# Patient Record
Sex: Male | Born: 1955 | Race: White | Hispanic: No | Smoking: Current every day smoker
Health system: Southern US, Community
[De-identification: ages and names within clinical notes are randomized; demographics above are authoritative.]

## PROBLEM LIST (undated history)

## (undated) DIAGNOSIS — B029 Zoster without complications: Secondary | ICD-10-CM

## (undated) DIAGNOSIS — J449 Chronic obstructive pulmonary disease, unspecified: Secondary | ICD-10-CM

## (undated) DIAGNOSIS — E119 Type 2 diabetes mellitus without complications: Secondary | ICD-10-CM

## (undated) HISTORY — PX: HAND SURGERY: SHX662

## (undated) HISTORY — PX: ADENOIDECTOMY: SUR15

## (undated) HISTORY — PX: INNER EAR SURGERY: SHX679

---

## 2012-05-30 ENCOUNTER — Encounter (HOSPITAL_BASED_OUTPATIENT_CLINIC_OR_DEPARTMENT_OTHER): Payer: Self-pay | Admitting: Emergency Medicine

## 2012-05-30 ENCOUNTER — Emergency Department (HOSPITAL_BASED_OUTPATIENT_CLINIC_OR_DEPARTMENT_OTHER)
Admission: EM | Admit: 2012-05-30 | Discharge: 2012-05-30 | Disposition: A | Discharge status: Home / Self Care | Payer: Self-pay | Attending: Emergency Medicine | Admitting: Emergency Medicine

## 2012-05-30 DIAGNOSIS — I10 Essential (primary) hypertension: Secondary | ICD-10-CM | POA: Insufficient documentation

## 2012-05-30 DIAGNOSIS — R110 Nausea: Secondary | ICD-10-CM | POA: Insufficient documentation

## 2012-05-30 DIAGNOSIS — J449 Chronic obstructive pulmonary disease, unspecified: Secondary | ICD-10-CM | POA: Insufficient documentation

## 2012-05-30 DIAGNOSIS — F17200 Nicotine dependence, unspecified, uncomplicated: Secondary | ICD-10-CM | POA: Insufficient documentation

## 2012-05-30 DIAGNOSIS — J4489 Other specified chronic obstructive pulmonary disease: Secondary | ICD-10-CM | POA: Insufficient documentation

## 2012-05-30 DIAGNOSIS — K029 Dental caries, unspecified: Secondary | ICD-10-CM

## 2012-05-30 DIAGNOSIS — E119 Type 2 diabetes mellitus without complications: Secondary | ICD-10-CM | POA: Insufficient documentation

## 2012-05-30 DIAGNOSIS — K089 Disorder of teeth and supporting structures, unspecified: Secondary | ICD-10-CM | POA: Insufficient documentation

## 2012-05-30 HISTORY — DX: Chronic obstructive pulmonary disease, unspecified: J44.9

## 2012-05-30 HISTORY — DX: Type 2 diabetes mellitus without complications: E11.9

## 2012-05-30 MED ORDER — OXYCODONE-ACETAMINOPHEN 5-325 MG PO TABS
2.0000 | ORAL_TABLET | Freq: Once | ORAL | Status: AC
  Administered 2012-05-30: 2 via ORAL
  Filled 2012-05-30 (×2): qty 2

## 2012-05-30 MED ORDER — OXYCODONE-ACETAMINOPHEN 5-325 MG PO TABS: ORAL_TABLET | ORAL | Status: DC

## 2012-05-30 MED ORDER — BUPIVACAINE HCL (PF) 0.5 % IJ SOLN
10.0000 mL | Freq: Once | INTRAMUSCULAR | Status: AC
  Administered 2012-05-30: 10 mL
  Filled 2012-05-30: qty 10

## 2012-05-30 MED ORDER — ERYTHROMYCIN BASE 250 MG PO TABS: 250.0000 mg | ORAL_TABLET | Freq: Four times a day (QID) | ORAL | Status: DC

## 2012-05-30 NOTE — ED Provider Notes (Addendum)
History  This chart was scribed for Benjamin Garrison. Oletta Lamas, MD by Thad Ranger, ED Scribe. This patient was seen in room MH02/MH02 and the patient's care was started at 5:35.  CSN: [id number]  Arrival date & time 05/30/12  1648   None     Chief Complaint  Patient presents with  . Jaw Pain   The history is provided by the patient. No language interpreter was used.    Tanuj Mullens is a 56 y.o. male with a history of dental pain and DM, who presents to the Emergency Department complaining of severe, localized, gradually worsening, constant right bottom jaw pain due to dental decay onset yesterday. There is associated nausea, and dizziness. Pain is aggravated by eating and drinking. He says that he took several pills of Advil with no relief. Patient denies fever, facial swelling, vomiting, ear pain, headache, and any other symptoms. He is a current everyday smoker but he denies alcohol use.    Past Medical History  Diagnosis Date  . COPD (chronic obstructive pulmonary disease)   . Hypertension   . Diabetes mellitus without complication     History reviewed. No pertinent past surgical history.  History reviewed. No pertinent family history.  History  Substance Use Topics  . Smoking status: Current Every Day Smoker -- 0.5 packs/day  . Smokeless tobacco: Not on file  . Alcohol Use: No     Review of Systems  Constitutional: Negative for fever and chills.  HENT: Positive for dental problem. Negative for ear pain and facial swelling.   Gastrointestinal: Positive for nausea. Negative for vomiting.  Neurological: Negative for headaches.    Allergies  Hydrocodone and Penicillins  Home Medications   Current Outpatient Rx  Name  Route  Sig  Dispense  Refill  . ERYTHROMYCIN BASE 250 MG PO TABS   Oral   Take 1 tablet (250 mg total) by mouth every 6 (six) hours.   28 tablet   0   . OXYCODONE-ACETAMINOPHEN 5-325 MG PO TABS      1-2 tablets po q 6 hours prn moderate to severe  pain   20 tablet   0     BP 127/81  Pulse 100  Temp 98.3 F (36.8 C) (Oral)  Resp 20  Ht 5\' 9"  (1.753 m)  Wt 240 lb (108.863 kg)  BMI 35.44 kg/m2  SpO2 95%  Physical Exam  Constitutional: He is oriented to person, place, and time. He appears well-developed and well-nourished.  HENT:  Head: Normocephalic and atraumatic.  Right Ear: External ear normal.  Left Ear: External ear normal.  Mouth/Throat: Oropharynx is clear and moist.       Patient is missing first molar.  There are several fillings. There is general tooth decay in the second and third molar. Second molar has severe tenderness, however there is no abscess.   Eyes: Conjunctivae normal and EOM are normal. Pupils are equal, round, and reactive to light. Left eye exhibits no discharge.  Neck: Normal range of motion. Neck supple.  Cardiovascular: Normal rate, regular rhythm and normal heart sounds.   No murmur heard. Pulmonary/Chest: Effort normal and breath sounds normal.  Abdominal: Soft. He exhibits no distension. There is no tenderness. There is no rebound.  Musculoskeletal: Normal range of motion. He exhibits no edema and no tenderness.  Neurological: He is alert and oriented to person, place, and time. He has normal reflexes. No cranial nerve deficit. Coordination normal.  Skin: Skin is warm and dry. No rash noted.  No erythema.    ED Course  NERVE BLOCK Date/Time: 05/30/2012 6:10 PM Performed by: Lear Ng. Authorized by: Lear Ng Consent: Verbal consent obtained. Risks and benefits: risks, benefits and alternatives were discussed Consent given by: patient Patient understanding: patient states understanding of the procedure being performed Patient consent: the patient's understanding of the procedure matches consent given Procedure consent: procedure consent matches procedure scheduled Patient identity confirmed: verbally with patient Time out: Immediately prior to procedure a "time out" was  called to verify the correct patient, procedure, equipment, support staff and site/side marked as required. Indications: pain relief Body area: head Nerve: inferior alveolar Laterality: right Patient sedated: no Preparation: Patient was prepped and draped in the usual sterile fashion. Patient position: sitting Needle gauge: 27 G Location technique: anatomical landmarks Local anesthetic: bupivacaine 0.5% without epinephrine Anesthetic total: 1.5 ml Outcome: pain improved Patient tolerance: Patient tolerated the procedure well with no immediate complications.   (including critical care time)  DIAGNOSTIC STUDIES: Oxygen Saturation is 95% on room air, normal by my interpretation.    COORDINATION OF CARE: 5:40 PM Discussed treatment plan with pt at bedside and pt agreed to plan.   Labs Reviewed - No data to display No results found.   1. Pain due to dental caries    ECG done at triage by protocol.  Time 16:57, sinus at rate 103, incomplete RBBB, right axis, no ST or T wave abn's.  No prior ECG's available.  Borderline ECG is my interpretation.    MDM  I personally performed the services described in this documentation, which was scribed in my presence. The recorded information has been reviewed and is accurate.  Pt with dental caries, dental pain adn likely referred pain to side of face, jaw and neck.  Very tender at 2nd molar on lower right.  Will refer to Dr. Mayford Knife, DDS and pt will call on Monday.     Benjamin Garrison. Oletta Lamas, MD 05/30/12 1812  Benjamin Garrison. Oletta Lamas, MD 05/30/12 1827

## 2012-05-30 NOTE — Discharge Instructions (Signed)
Narcotic and benzodiazepine use may cause drowsiness, slowed breathing or dependence.  Please use with caution and do not drive, operate machinery or watch young children alone while taking them.  Taking combinations of these medications or drinking alcohol will potentiate these effects.    

## 2012-05-30 NOTE — ED Notes (Signed)
Pt with extensive dental decay reports right jaw pain and nausea as well as dizziness.

## 2012-09-29 ENCOUNTER — Emergency Department (HOSPITAL_BASED_OUTPATIENT_CLINIC_OR_DEPARTMENT_OTHER)
Admission: EM | Admit: 2012-09-29 | Discharge: 2012-09-29 | Disposition: A | Discharge status: Home / Self Care | Payer: Self-pay | Attending: Emergency Medicine | Admitting: Emergency Medicine

## 2012-09-29 ENCOUNTER — Encounter (HOSPITAL_BASED_OUTPATIENT_CLINIC_OR_DEPARTMENT_OTHER): Payer: Self-pay | Admitting: *Deleted

## 2012-09-29 ENCOUNTER — Emergency Department (HOSPITAL_BASED_OUTPATIENT_CLINIC_OR_DEPARTMENT_OTHER): Payer: Self-pay

## 2012-09-29 DIAGNOSIS — R0602 Shortness of breath: Secondary | ICD-10-CM | POA: Insufficient documentation

## 2012-09-29 DIAGNOSIS — R209 Unspecified disturbances of skin sensation: Secondary | ICD-10-CM | POA: Insufficient documentation

## 2012-09-29 DIAGNOSIS — R5381 Other malaise: Secondary | ICD-10-CM | POA: Insufficient documentation

## 2012-09-29 DIAGNOSIS — R112 Nausea with vomiting, unspecified: Secondary | ICD-10-CM | POA: Insufficient documentation

## 2012-09-29 DIAGNOSIS — IMO0001 Reserved for inherently not codable concepts without codable children: Secondary | ICD-10-CM | POA: Insufficient documentation

## 2012-09-29 DIAGNOSIS — J4489 Other specified chronic obstructive pulmonary disease: Secondary | ICD-10-CM | POA: Insufficient documentation

## 2012-09-29 DIAGNOSIS — R109 Unspecified abdominal pain: Secondary | ICD-10-CM | POA: Insufficient documentation

## 2012-09-29 DIAGNOSIS — E119 Type 2 diabetes mellitus without complications: Secondary | ICD-10-CM | POA: Insufficient documentation

## 2012-09-29 DIAGNOSIS — I10 Essential (primary) hypertension: Secondary | ICD-10-CM | POA: Insufficient documentation

## 2012-09-29 DIAGNOSIS — G629 Polyneuropathy, unspecified: Secondary | ICD-10-CM

## 2012-09-29 DIAGNOSIS — R531 Weakness: Secondary | ICD-10-CM

## 2012-09-29 DIAGNOSIS — G589 Mononeuropathy, unspecified: Secondary | ICD-10-CM | POA: Insufficient documentation

## 2012-09-29 DIAGNOSIS — F17200 Nicotine dependence, unspecified, uncomplicated: Secondary | ICD-10-CM | POA: Insufficient documentation

## 2012-09-29 LAB — URINALYSIS, ROUTINE W REFLEX MICROSCOPIC
Bilirubin Urine: NEGATIVE
Glucose, UA: NEGATIVE mg/dL
Hgb urine dipstick: NEGATIVE
Ketones, ur: NEGATIVE mg/dL
Leukocytes, UA: NEGATIVE
Nitrite: NEGATIVE
Protein, ur: NEGATIVE mg/dL
Specific Gravity, Urine: 1.011 (ref 1.005–1.030)
Urobilinogen, UA: 0.2 mg/dL (ref 0.0–1.0)
pH: 6 (ref 5.0–8.0)

## 2012-09-29 LAB — COMPREHENSIVE METABOLIC PANEL
ALT: 24 U/L (ref 0–53)
AST: 17 U/L (ref 0–37)
Albumin: 3.9 g/dL (ref 3.5–5.2)
Alkaline Phosphatase: 77 U/L (ref 39–117)
BUN: 13 mg/dL (ref 6–23)
CO2: 28 mEq/L (ref 19–32)
Calcium: 9.8 mg/dL (ref 8.4–10.5)
Chloride: 100 mEq/L (ref 96–112)
Creatinine, Ser: 0.7 mg/dL (ref 0.50–1.35)
GFR calc Af Amer: >90 mL/min (ref >90)
GFR calc non Af Amer: >90 mL/min (ref >90)
Glucose, Bld: 154 mg/dL — ABNORMAL HIGH (ref 70–99)
Potassium: 4.6 mEq/L (ref 3.5–5.1)
Sodium: 137 mEq/L (ref 135–145)
Total Bilirubin: 0.5 mg/dL (ref 0.3–1.2)
Total Protein: 7.6 g/dL (ref 6.0–8.3)

## 2012-09-29 LAB — CBC WITH DIFFERENTIAL/PLATELET
Basophils Absolute: 0 10*3/uL (ref 0.0–0.1)
Basophils Relative: 0 % (ref 0–1)
Eosinophils Absolute: 0.2 10*3/uL (ref 0.0–0.7)
Eosinophils Relative: 3 % (ref 0–5)
HCT: 46 % (ref 39.0–52.0)
Hemoglobin: 15.8 g/dL (ref 13.0–17.0)
Lymphocytes Relative: 26 % (ref 12–46)
Lymphs Abs: 2 10*3/uL (ref 0.7–4.0)
MCH: 31.1 pg (ref 26.0–34.0)
MCHC: 34.3 g/dL (ref 30.0–36.0)
MCV: 90.6 fL (ref 78.0–100.0)
Monocytes Absolute: 0.7 10*3/uL (ref 0.1–1.0)
Monocytes Relative: 9 % (ref 3–12)
Neutro Abs: 4.8 10*3/uL (ref 1.7–7.7)
Neutrophils Relative %: 62 % (ref 43–77)
Platelets: 168 10*3/uL (ref 150–400)
RBC: 5.08 MIL/uL (ref 4.22–5.81)
RDW: 14.3 % (ref 11.5–15.5)
WBC: 7.8 10*3/uL (ref 4.0–10.5)

## 2012-09-29 LAB — LIPASE, BLOOD: Lipase: 30 U/L (ref 11–59)

## 2012-09-29 MED ORDER — ONDANSETRON 4 MG PO TBDP: 4.0000 mg | ORAL_TABLET | Freq: Three times a day (TID) | ORAL | Status: DC | PRN

## 2012-09-29 MED ORDER — ALBUTEROL SULFATE (5 MG/ML) 0.5% IN NEBU
5.0000 mg | INHALATION_SOLUTION | Freq: Once | RESPIRATORY_TRACT | Status: AC
  Administered 2012-09-29: 5 mg via RESPIRATORY_TRACT
  Filled 2012-09-29: qty 1

## 2012-09-29 MED ORDER — IOHEXOL 300 MG/ML  SOLN
50.0000 mL | Freq: Once | INTRAMUSCULAR | Status: AC | PRN
  Administered 2012-09-29: 50 mL via ORAL

## 2012-09-29 MED ORDER — IPRATROPIUM BROMIDE 0.02 % IN SOLN
0.5000 mg | Freq: Once | RESPIRATORY_TRACT | Status: AC
  Administered 2012-09-29: 0.5 mg via RESPIRATORY_TRACT
  Filled 2012-09-29: qty 2.5

## 2012-09-29 MED ORDER — IOHEXOL 300 MG/ML  SOLN
100.0000 mL | Freq: Once | INTRAMUSCULAR | Status: AC | PRN
  Administered 2012-09-29: 100 mL via INTRAVENOUS

## 2012-09-29 MED ORDER — SODIUM CHLORIDE 0.9 % IV BOLUS (SEPSIS)
1000.0000 mL | Freq: Once | INTRAVENOUS | Status: AC
  Administered 2012-09-29: 1000 mL via INTRAVENOUS

## 2012-09-29 MED ORDER — ONDANSETRON 8 MG PO TBDP
8.0000 mg | ORAL_TABLET | Freq: Once | ORAL | Status: AC
  Administered 2012-09-29: 8 mg via ORAL
  Filled 2012-09-29: qty 1

## 2012-09-29 MED ORDER — MORPHINE SULFATE 4 MG/ML IJ SOLN
4.0000 mg | Freq: Once | INTRAMUSCULAR | Status: AC
  Administered 2012-09-29: 4 mg via INTRAVENOUS
  Filled 2012-09-29: qty 1

## 2012-09-29 MED ORDER — TRAMADOL HCL 50 MG PO TABS: 50.0000 mg | ORAL_TABLET | Freq: Four times a day (QID) | ORAL | Status: DC | PRN

## 2012-09-29 NOTE — ED Provider Notes (Signed)
Medical screening examination/treatment/procedure(s) were performed by non-physician practitioner and as supervising physician I was immediately available for consultation/collaboration.   Aliya Sol B. Travonta Gill, MD 09/29/12 1959 

## 2012-09-29 NOTE — ED Provider Notes (Signed)
History     CSN: [id number]  Arrival date & time 09/29/12  1207   First MD Initiated Contact with Patient 09/29/12 1233      Chief Complaint  Patient presents with  . right shoulder pain   . Nausea    (Consider location/radiation/quality/duration/timing/severity/associated sxs/prior treatment) The history is provided by the patient. No language interpreter was used.  Pt is 57yo male with uncontrolled diabetes and known COPD.  States he has felt generally bad over the past 3-4 days. Has had increased stomach pain and nausea and a "few" episodes of vomiting.   Has severe burning and tingling in his feet which radiates to mid-calf, bilaterally. Also c/o moderate right shoulder pain. Does not recall injuring shoulder. Has not taken anything for his leg or shoulder pain. Pain has not improved since onset. Does not have a PCP to f/u with.  Past Medical History  Diagnosis Date  . COPD (chronic obstructive pulmonary disease)   . Hypertension   . Diabetes mellitus without complication     History reviewed. No pertinent past surgical history.  History reviewed. No pertinent family history.  History  Substance Use Topics  . Smoking status: Current Every Day Smoker -- 0.50 packs/day  . Smokeless tobacco: Not on file  . Alcohol Use: No      Review of Systems  Constitutional: Positive for fatigue. Negative for fever, chills and diaphoresis.  Respiratory: Positive for shortness of breath.   Cardiovascular: Negative for chest pain and leg swelling.  Gastrointestinal: Positive for nausea and vomiting. Negative for diarrhea.  Musculoskeletal: Positive for myalgias.  Skin: Negative for color change.    Allergies  Hydrocodone and Penicillins  Home Medications   Current Outpatient Rx  Name  Route  Sig  Dispense  Refill  . erythromycin (E-MYCIN) 250 MG tablet   Oral   Take 1 tablet (250 mg total) by mouth every 6 (six) hours.   28 tablet   0   . oxyCODONE-acetaminophen  (PERCOCET/ROXICET) 5-325 MG per tablet      1-2 tablets po q 6 hours prn moderate to severe pain   20 tablet   0     BP 122/77  Pulse 109  Temp(Src) 98.2 F (36.8 C) (Oral)  Resp 18  Ht 5\' 9"  (1.753 m)  Wt 230 lb (104.327 kg)  BMI 33.95 kg/m2  SpO2 96%  Physical Exam  Constitutional: He appears well-developed and well-nourished. No distress.  HENT:  Head: Normocephalic and atraumatic.  Mouth/Throat: Oropharynx is clear and moist. No oropharyngeal exudate.  Eyes: Conjunctivae and EOM are normal. Pupils are equal, round, and reactive to light.  Neck: Normal range of motion. Neck supple.  Cardiovascular: Normal rate, regular rhythm and normal heart sounds.   Pulmonary/Chest: Effort normal. No respiratory distress. He has wheezes ( throughout all lung fields). He has no rales. He exhibits no tenderness.  Abdominal: Soft. Bowel sounds are normal. He exhibits mass ( umbilical hernia). He exhibits no distension. There is tenderness ( moderate TTP LUQ and LLQ. ). There is no rebound and no guarding.  Musculoskeletal: Normal range of motion.  Lymphadenopathy:    He has no cervical adenopathy.  Neurological: He is alert.  Skin: Skin is warm and dry. No rash noted. He is not diaphoretic. No erythema.    ED Course  Procedures (including critical care time)  Labs Reviewed - No data to display No results found.   No diagnosis found.    MDM  Pt is a 56yo  male with uncontrolled diabetes and COPD. States he lost his father about 2 weeks ago and has not been taking very good care of himself, including not eating much.  Over past 3-4 days feels generally weak, nauseous, and noticed increased neuropathy pain in both feet up to mid-calf, right shoulder pain, and LLQ pain. Vomited a "few" times.  Checked blood glucose yesterday and today avg 200. His norm: 140-150.  PMH significant for umbilical hernia.    Concern for infectious process, diverticulitis, pancreatitis, possible  pneumonia  12:55 PM Will run basic labs. Gave fluid bolus with morphine and zofran.  Will get CT of abdomen and pelvis.  Without fever, and with hx of known COPD, believe wheezing is chronic. CXR would not be beneficial at this time. Will wait on labs.  Filed Vitals:   09/29/12 1222  BP: 122/77  Pulse: 109  Temp: 98.2 F (36.8 C)  Resp: 18   2:59 PM Labs have come back and are unremarkable.   CT-no acute changes  Pt just finished neb tx and states he is breathing better.  Still c/o stomach pain and leg pain.  Will give another dose of morphine and try PO challenge.  Will likely DC home with tramadol and zofran.   Have pt follow up with urgent care or primary care.  Gave pt Recruitment consultant.  Encouraged pt to start eating more regularly and expressed importance of having continuous care and monitoring for diabetes.  Vitals: unremarkable. Discharged in stable condition.    Discussed pt with attending during ED encounter.       Junius Finner, PA-C 09/29/12 [phone]

## 2012-09-29 NOTE — ED Notes (Signed)
Feeling bad for 3-4 days is having neuropathy in both feet that is bothering him also having abdominal pain with nausea and vomiting onset last night is diabetic last checked his cbg at 1100 today was in 200's. Also has right shoulder pain

## 2013-06-02 ENCOUNTER — Emergency Department (HOSPITAL_BASED_OUTPATIENT_CLINIC_OR_DEPARTMENT_OTHER): Payer: Self-pay

## 2013-06-02 ENCOUNTER — Encounter (HOSPITAL_BASED_OUTPATIENT_CLINIC_OR_DEPARTMENT_OTHER): Payer: Self-pay | Admitting: Emergency Medicine

## 2013-06-02 ENCOUNTER — Emergency Department (HOSPITAL_BASED_OUTPATIENT_CLINIC_OR_DEPARTMENT_OTHER)
Admission: EM | Admit: 2013-06-02 | Discharge: 2013-06-02 | Disposition: A | Discharge status: Home / Self Care | Payer: Self-pay | Attending: Emergency Medicine | Admitting: Emergency Medicine

## 2013-06-02 DIAGNOSIS — R3915 Urgency of urination: Secondary | ICD-10-CM | POA: Insufficient documentation

## 2013-06-02 DIAGNOSIS — Z87448 Personal history of other diseases of urinary system: Secondary | ICD-10-CM | POA: Insufficient documentation

## 2013-06-02 DIAGNOSIS — F17200 Nicotine dependence, unspecified, uncomplicated: Secondary | ICD-10-CM | POA: Insufficient documentation

## 2013-06-02 DIAGNOSIS — R300 Dysuria: Secondary | ICD-10-CM | POA: Insufficient documentation

## 2013-06-02 DIAGNOSIS — J40 Bronchitis, not specified as acute or chronic: Secondary | ICD-10-CM

## 2013-06-02 DIAGNOSIS — IMO0001 Reserved for inherently not codable concepts without codable children: Secondary | ICD-10-CM | POA: Insufficient documentation

## 2013-06-02 DIAGNOSIS — J449 Chronic obstructive pulmonary disease, unspecified: Secondary | ICD-10-CM

## 2013-06-02 DIAGNOSIS — J441 Chronic obstructive pulmonary disease with (acute) exacerbation: Secondary | ICD-10-CM | POA: Insufficient documentation

## 2013-06-02 DIAGNOSIS — E1142 Type 2 diabetes mellitus with diabetic polyneuropathy: Secondary | ICD-10-CM | POA: Insufficient documentation

## 2013-06-02 DIAGNOSIS — R350 Frequency of micturition: Secondary | ICD-10-CM | POA: Insufficient documentation

## 2013-06-02 DIAGNOSIS — R109 Unspecified abdominal pain: Secondary | ICD-10-CM | POA: Insufficient documentation

## 2013-06-02 DIAGNOSIS — R51 Headache: Secondary | ICD-10-CM | POA: Insufficient documentation

## 2013-06-02 DIAGNOSIS — Z880 Allergy status to penicillin: Secondary | ICD-10-CM | POA: Insufficient documentation

## 2013-06-02 DIAGNOSIS — R110 Nausea: Secondary | ICD-10-CM | POA: Insufficient documentation

## 2013-06-02 DIAGNOSIS — R61 Generalized hyperhidrosis: Secondary | ICD-10-CM | POA: Insufficient documentation

## 2013-06-02 DIAGNOSIS — E1149 Type 2 diabetes mellitus with other diabetic neurological complication: Secondary | ICD-10-CM | POA: Insufficient documentation

## 2013-06-02 LAB — URINALYSIS, ROUTINE W REFLEX MICROSCOPIC
Bilirubin Urine: NEGATIVE
Glucose, UA: NEGATIVE mg/dL
Ketones, ur: NEGATIVE mg/dL
Nitrite: NEGATIVE
Protein, ur: NEGATIVE mg/dL
pH: 6 (ref 5.0–8.0)

## 2013-06-02 LAB — GLUCOSE, CAPILLARY

## 2013-06-02 MED ORDER — PREDNISONE 20 MG PO TABS: 60.0000 mg | ORAL_TABLET | Freq: Every day | ORAL | Status: AC

## 2013-06-02 MED ORDER — OXYCODONE-ACETAMINOPHEN 5-325 MG PO TABS: 1.0000 | ORAL_TABLET | ORAL | Status: DC | PRN

## 2013-06-02 MED ORDER — IPRATROPIUM BROMIDE 0.02 % IN SOLN
0.5000 mg | Freq: Once | RESPIRATORY_TRACT | Status: AC
  Administered 2013-06-02: 0.5 mg via RESPIRATORY_TRACT
  Filled 2013-06-02: qty 2.5

## 2013-06-02 MED ORDER — OXYCODONE-ACETAMINOPHEN 5-325 MG PO TABS
1.0000 | ORAL_TABLET | Freq: Once | ORAL | Status: AC
  Administered 2013-06-02: 1 via ORAL
  Filled 2013-06-02: qty 1

## 2013-06-02 MED ORDER — ALBUTEROL SULFATE (5 MG/ML) 0.5% IN NEBU
5.0000 mg | INHALATION_SOLUTION | Freq: Once | RESPIRATORY_TRACT | Status: AC
  Administered 2013-06-02: 5 mg via RESPIRATORY_TRACT
  Filled 2013-06-02: qty 1

## 2013-06-02 MED ORDER — AZITHROMYCIN 250 MG PO TABS: 250.0000 mg | ORAL_TABLET | Freq: Every day | ORAL | Status: DC

## 2013-06-02 MED ORDER — AZITHROMYCIN 250 MG PO TABS
500.0000 mg | ORAL_TABLET | Freq: Once | ORAL | Status: AC
  Administered 2013-06-02: 500 mg via ORAL
  Filled 2013-06-02: qty 2

## 2013-06-02 MED ORDER — PREDNISONE 10 MG PO TABS
60.0000 mg | ORAL_TABLET | Freq: Once | ORAL | Status: AC
  Administered 2013-06-02: 60 mg via ORAL
  Filled 2013-06-02 (×2): qty 1

## 2013-06-02 MED ORDER — ALBUTEROL SULFATE HFA 108 (90 BASE) MCG/ACT IN AERS
2.0000 | INHALATION_SPRAY | RESPIRATORY_TRACT | Status: DC | PRN
  Administered 2013-06-02: 2 via RESPIRATORY_TRACT
  Filled 2013-06-02: qty 6.7

## 2013-06-02 NOTE — ED Notes (Addendum)
Pt c/o cough and body aches  , back pain x 2 weeks

## 2013-06-02 NOTE — ED Provider Notes (Signed)
Medical screening examination/treatment/procedure(s) were performed by non-physician practitioner and as supervising physician I was immediately available for consultation/collaboration.  EKG Interpretation    Date/Time:  Wednesday June 02 2013 15:27:38 EST Ventricular Rate:  84 PR Interval:  146 QRS Duration: 98 QT Interval:  366 QTC Calculation: 432 R Axis:   78 Text Interpretation:  Sinus rhythm with Premature atrial complexes Otherwise normal ECG No significant change since last tracing Confirmed by Anitra Lauth  MD, Sinjin Amero ([id number]) on 06/02/2013 3:40:36 PM              Gwyneth Sprout, MD 06/02/13 2130

## 2013-06-02 NOTE — ED Provider Notes (Signed)
CSN: [id number]     Arrival date & time 06/02/13  1431 History   First MD Initiated Contact with Patient 06/02/13 1437     Chief Complaint  Patient presents with  . Cough   (Consider location/radiation/quality/duration/timing/severity/associated sxs/prior Treatment) HPI Comments: A 57 year old with hx of uncontrolled DM II with neuropathy, CPOD and BPH present to ED for productive cough X 3 weeks. Initially cough was greenish now it becomes greenish brown since last 3 to 4 days and getting worse.   Patient is a 57 y.o. male presenting with cough. The history is provided by the patient. No language interpreter was used.  Cough Smoker: yes ( 5 to 6 cigrattes/day.)   Relieved by:  Nothing Worsened by:  Lying down and deep breathing Ineffective treatments:  Home nebulizer (using his brother's nebulizer ) Associated symptoms: chest pain, diaphoresis, headaches, myalgias, shortness of breath, sinus congestion and wheezing   Associated symptoms: no chills, no ear fullness, no ear pain, no eye discharge, no fever, no rash, no rhinorrhea, no sore throat and no weight loss     Past Medical History  Diagnosis Date  . COPD (chronic obstructive pulmonary disease)   . Diabetes mellitus without complication    History reviewed. No pertinent past surgical history. History reviewed. No pertinent family history. History  Substance Use Topics  . Smoking status: Current Every Day Smoker -- 0.50 packs/day  . Smokeless tobacco: Not on file  . Alcohol Use: No    Review of Systems  Constitutional: Positive for diaphoresis. Negative for fever, chills and weight loss.  HENT: Positive for congestion, postnasal drip and sinus pressure. Negative for ear pain, rhinorrhea, sore throat and trouble swallowing.   Eyes: Negative for pain and discharge.  Respiratory: Positive for cough, chest tightness, shortness of breath and wheezing.   Cardiovascular: Positive for chest pain.  Gastrointestinal: Positive for  nausea. Negative for vomiting and diarrhea.       Suprapubic pain  Endocrine: Positive for polydipsia and polyuria.  Genitourinary: Positive for urgency, frequency and difficulty urinating. Negative for flank pain.  Musculoskeletal: Positive for myalgias.  Skin: Negative for rash.  Neurological: Positive for headaches.    Allergies  Hydrocodone and Penicillins  Home Medications   Current Outpatient Rx  Name  Route  Sig  Dispense  Refill  . azithromycin (ZITHROMAX Z-PAK) 250 MG tablet   Oral   Take 1 tablet (250 mg total) by mouth daily.   4 tablet   0   . predniSONE (DELTASONE) 20 MG tablet   Oral   Take 3 tablets (60 mg total) by mouth daily.   9 tablet   0    BP 115/65  Pulse 89  Temp(Src) 97.7 F (36.5 C) (Oral)  Resp 18  Ht 5\' 8"  (1.727 m)  Wt 210 lb (95.255 kg)  BMI 31.94 kg/m2  SpO2 100% Physical Exam  Constitutional: He is oriented to person, place, and time. He appears well-developed and well-nourished.  HENT:  TTP at frontal and maxillary sinus. Tube placed in R ear. Uvula midline. Tonsils without erythema or edema. Posterior pharynx with erythema and drainage.   Neck:  TTP at anterior neck. No adenopathy.   Cardiovascular: Normal rate, regular rhythm, normal heart sounds and intact distal pulses.   Pulmonary/Chest: Effort normal. He has wheezes. He exhibits tenderness.  Abdominal: Soft. Bowel sounds are normal.  umbilical hernia and TTP at suprapubic area  Neurological: He is alert and oriented to person, place, and time.  Intact sensation bilaterally. No feet ulceration.  Skin: No rash noted.    ED Course  Procedures (including critical care time) Labs Review Labs Reviewed  GLUCOSE, CAPILLARY  URINALYSIS, ROUTINE W REFLEX MICROSCOPIC   Imaging Review Dg Chest 2 View  06/02/2013   CLINICAL DATA:  Cough.  EXAM: CHEST  2 VIEW  COMPARISON:  None.  FINDINGS: The heart size and mediastinal contours are within normal limits. Mild hyperexpansion of  the lungs is noted suggesting chronic obstructive pulmonary disease. Chronic central bronchitic changes are noted. No acute abnormality is noted. The visualized skeletal structures are unremarkable.  IMPRESSION: Findings consistent with chronic obstructive pulmonary disease. No acute cardiopulmonary abnormality seen.   Electronically Signed   By: Roque Lias M.D.   On: 06/02/2013 16:08    EKG Interpretation    Date/Time:  Wednesday June 02 2013 15:27:38 EST Ventricular Rate:  84 PR Interval:  146 QRS Duration: 98 QT Interval:  366 QTC Calculation: 432 R Axis:   78 Text Interpretation:  Sinus rhythm with Premature atrial complexes Otherwise normal ECG No significant change since last tracing Confirmed by Anitra Lauth  MD, WHITNEY ([id number]) on 06/02/2013 3:40:36 PM            MDM   1. Bronchitis   2. COPD (chronic obstructive pulmonary disease)    Patient with COPD, continuous smoker (5-6 cigarettes daily) with cough, night sweats, chest tightness with cough. He reports urinary frequency, polydipsia as an untreated diabetic but with CBG of 97. CXR without PNA, no hypoxia. Will treat with abx as bronchitis, steroids for COPD exacerbation and encourage PCP follow up.    Arnoldo Hooker, PA-C 06/02/13 1620

## 2014-03-03 ENCOUNTER — Encounter (HOSPITAL_BASED_OUTPATIENT_CLINIC_OR_DEPARTMENT_OTHER): Payer: Self-pay | Admitting: Emergency Medicine

## 2014-03-03 ENCOUNTER — Emergency Department (HOSPITAL_BASED_OUTPATIENT_CLINIC_OR_DEPARTMENT_OTHER)
Admission: EM | Admit: 2014-03-03 | Discharge: 2014-03-03 | Disposition: A | Discharge status: Home / Self Care | Payer: Self-pay | Attending: Emergency Medicine | Admitting: Emergency Medicine

## 2014-03-03 ENCOUNTER — Emergency Department (HOSPITAL_BASED_OUTPATIENT_CLINIC_OR_DEPARTMENT_OTHER): Payer: Self-pay

## 2014-03-03 DIAGNOSIS — F17200 Nicotine dependence, unspecified, uncomplicated: Secondary | ICD-10-CM | POA: Insufficient documentation

## 2014-03-03 DIAGNOSIS — E119 Type 2 diabetes mellitus without complications: Secondary | ICD-10-CM | POA: Insufficient documentation

## 2014-03-03 DIAGNOSIS — Z792 Long term (current) use of antibiotics: Secondary | ICD-10-CM | POA: Insufficient documentation

## 2014-03-03 DIAGNOSIS — J441 Chronic obstructive pulmonary disease with (acute) exacerbation: Secondary | ICD-10-CM | POA: Insufficient documentation

## 2014-03-03 DIAGNOSIS — Z880 Allergy status to penicillin: Secondary | ICD-10-CM | POA: Insufficient documentation

## 2014-03-03 DIAGNOSIS — R0602 Shortness of breath: Secondary | ICD-10-CM | POA: Insufficient documentation

## 2014-03-03 DIAGNOSIS — G629 Polyneuropathy, unspecified: Secondary | ICD-10-CM

## 2014-03-03 DIAGNOSIS — G589 Mononeuropathy, unspecified: Secondary | ICD-10-CM | POA: Insufficient documentation

## 2014-03-03 LAB — BASIC METABOLIC PANEL
Anion gap: 11 (ref 5–15)
BUN: 17 mg/dL (ref 6–23)
CHLORIDE: 101 meq/L (ref 96–112)
CO2: 27 meq/L (ref 19–32)
Calcium: 10 mg/dL (ref 8.4–10.5)
Creatinine, Ser: 0.8 mg/dL (ref 0.50–1.35)
GFR calc Af Amer: >90 mL/min (ref >90)
GFR calc non Af Amer: >90 mL/min (ref >90)
GLUCOSE: 111 mg/dL — AB (ref 70–99)
POTASSIUM: 4.3 meq/L (ref 3.7–5.3)
Sodium: 139 mEq/L (ref 137–147)

## 2014-03-03 LAB — CBG MONITORING, ED: Glucose-Capillary: 117 mg/dL — ABNORMAL HIGH (ref 70–99)

## 2014-03-03 LAB — CBC
HEMATOCRIT: 46.8 % (ref 39.0–52.0)
HEMOGLOBIN: 15.9 g/dL (ref 13.0–17.0)
MCH: 31.2 pg (ref 26.0–34.0)
MCHC: 34 g/dL (ref 30.0–36.0)
MCV: 91.9 fL (ref 78.0–100.0)
Platelets: 168 10*3/uL (ref 150–400)
RBC: 5.09 MIL/uL (ref 4.22–5.81)
RDW: 14.8 % (ref 11.5–15.5)
WBC: 6.8 10*3/uL (ref 4.0–10.5)

## 2014-03-03 LAB — TROPONIN I

## 2014-03-03 MED ORDER — TRAMADOL HCL 50 MG PO TABS: 50.0000 mg | ORAL_TABLET | Freq: Four times a day (QID) | ORAL | Status: AC | PRN

## 2014-03-03 MED ORDER — ALBUTEROL SULFATE HFA 108 (90 BASE) MCG/ACT IN AERS
2.0000 | INHALATION_SPRAY | RESPIRATORY_TRACT | Status: DC | PRN
  Administered 2014-03-03: 2 via RESPIRATORY_TRACT
  Filled 2014-03-03: qty 6.7

## 2014-03-03 MED ORDER — TRAMADOL HCL 50 MG PO TABS
50.0000 mg | ORAL_TABLET | Freq: Once | ORAL | Status: AC
  Administered 2014-03-03: 50 mg via ORAL
  Filled 2014-03-03: qty 1

## 2014-03-03 MED ORDER — PREDNISONE 20 MG PO TABS: 40.0000 mg | ORAL_TABLET | Freq: Every day | ORAL | Status: AC

## 2014-03-03 MED ORDER — IPRATROPIUM BROMIDE 0.02 % IN SOLN
0.5000 mg | Freq: Once | RESPIRATORY_TRACT | Status: AC
  Administered 2014-03-03: 0.5 mg via RESPIRATORY_TRACT
  Filled 2014-03-03: qty 2.5

## 2014-03-03 MED ORDER — PREDNISONE 50 MG PO TABS
60.0000 mg | ORAL_TABLET | Freq: Once | ORAL | Status: AC
  Administered 2014-03-03: 60 mg via ORAL
  Filled 2014-03-03 (×2): qty 1

## 2014-03-03 MED ORDER — ALBUTEROL SULFATE (2.5 MG/3ML) 0.083% IN NEBU
5.0000 mg | INHALATION_SOLUTION | Freq: Once | RESPIRATORY_TRACT | Status: AC
  Administered 2014-03-03: 5 mg via RESPIRATORY_TRACT
  Filled 2014-03-03: qty 6

## 2014-03-03 NOTE — ED Notes (Signed)
NP at bedside.

## 2014-03-03 NOTE — ED Notes (Signed)
Pt c/o increased SOB x 4 days with lower back pain also c/o " feet are on fire"

## 2014-03-03 NOTE — Discharge Instructions (Signed)
Chronic Obstructive Pulmonary Disease Exacerbation ° Chronic obstructive pulmonary disease (COPD) is a common lung problem. In COPD, the flow of air from the lungs is limited. COPD exacerbations are times that breathing gets worse and you need extra treatment. Without treatment they can be life threatening. If they happen often, your lungs can become more damaged. °HOME CARE °· Do not smoke. °· Avoid tobacco smoke and other things that bother your lungs. °· If given, take your antibiotic medicine as told. Finish the medicine even if you start to feel better. °· Only take medicines as told by your doctor. °· Drink enough fluids to keep your pee (urine) clear or pale yellow (unless your doctor has told you not to). °· Use a cool mist machine (vaporizer). °· If you use oxygen or a machine that turns liquid medicine into a mist (nebulizer), continue to use them as told. °· Keep up with shots (vaccinations) as told by your doctor. °· Exercise regularly. °· Eat healthy foods. °· Keep all doctor visits as told. °GET HELP RIGHT AWAY IF: °· You are very short of breath and it gets worse. °· You have trouble talking. °· You have bad chest pain. °· You have blood in your spit (sputum). °· You have a fever. °· You keep throwing up (vomiting). °· You feel weak, or you pass out (faint). °· You feel confused. °· You keep getting worse. °MAKE SURE YOU:  °· Understand these instructions. °· Will watch your condition. °· Will get help right away if you are not doing well or get worse. °Document Released: 06/20/2011 Document Revised: 04/21/2013 Document Reviewed: 03/05/2013 °[employer]® Patient Information ©2015 [employer]. This information is not intended to replace advice given to you by your health care provider. Make sure you discuss any questions you have with your health care provider. ° °

## 2014-03-03 NOTE — ED Provider Notes (Signed)
Medical screening examination/treatment/procedure(s) were performed by non-physician practitioner and as supervising physician I was immediately available for consultation/collaboration.   EKG Interpretation   Date/Time:  Thursday March 03 2014 17:14:45 EDT Ventricular Rate:  84 PR Interval:  136 QRS Duration: 100 QT Interval:  362 QTC Calculation: 427 R Axis:   101 Text Interpretation:  Normal sinus rhythm Rightward axis Incomplete right  bundle branch block Borderline ECG No significant change since last  tracing Confirmed by YAO  MD, DAVID ([id number]) on 03/03/2014 5:18:57 PM        Richardean Canalavid H Yao, MD 03/03/14 2233

## 2014-03-03 NOTE — ED Provider Notes (Signed)
CSN: [id number]     Arrival date & time 03/03/14  1642 History   First MD Initiated Contact with Patient 03/03/14 1650     Chief Complaint  Patient presents with  . Shortness of Breath     (Consider location/radiation/quality/duration/timing/severity/associated sxs/prior Treatment) HPI Comments: Pt state that he has had increased sob and wheezing for the last 3 days. Pt states that he has neuropathy in his feet and his is having a lot of pain associated with it. Pt denies fever. States that he has had a cough. States that his cbg is good but couldn't report a number. Smokes cigarettes when he can afford it. States that his lower back hurts when he coughs. Pt states that he has intermittent had cp but he thinks it is related to his newly diagnosed pneumonia  The history is provided by the patient. No language interpreter was used.    Past Medical History  Diagnosis Date  . COPD (chronic obstructive pulmonary disease)   . Diabetes mellitus without complication    History reviewed. No pertinent past surgical history. History reviewed. No pertinent family history. History  Substance Use Topics  . Smoking status: Current Every Day Smoker -- 0.50 packs/day  . Smokeless tobacco: Not on file  . Alcohol Use: No    Review of Systems  Constitutional: Negative.   Respiratory: Positive for shortness of breath and wheezing.   Cardiovascular: Positive for chest pain.      Allergies  Hydrocodone and Penicillins  Home Medications   Prior to Admission medications   Medication Sig Start Date End Date Taking? Authorizing Provider  azithromycin (ZITHROMAX Z-PAK) 250 MG tablet Take 1 tablet (250 mg total) by mouth daily. 06/02/13   Shari A Upstill, PA-C  oxyCODONE-acetaminophen (PERCOCET/ROXICET) 5-325 MG per tablet Take 1 tablet by mouth every 4 (four) hours as needed for severe pain. 06/02/13   Shari A Upstill, PA-C  predniSONE (DELTASONE) 20 MG tablet Take 3 tablets (60 mg total) by mouth  daily. 06/03/13   Shari A Upstill, PA-C   BP 105/71  Pulse 92  Temp(Src) 98.1 F (36.7 C) (Oral)  Resp 18  Ht 5\' 8"  (1.727 m)  Wt 215 lb (97.523 kg)  BMI 32.70 kg/m2  SpO2 94% Physical Exam  Nursing note and vitals reviewed. Constitutional: He is oriented to person, place, and time. He appears well-developed and well-nourished.  HENT:  Head: Normocephalic.  Cardiovascular: Normal rate and regular rhythm.   Pulmonary/Chest: He has wheezes.  Abdominal: Soft. There is no tenderness.  Musculoskeletal: Normal range of motion.  Neurological: He is alert and oriented to person, place, and time.  Skin: Skin is warm and dry.    ED Course  Procedures (including critical care time) Labs Review Labs Reviewed  BASIC METABOLIC PANEL - Abnormal; Notable for the following:    Glucose, Bld 111 (*)    All other components within normal limits  CBG MONITORING, ED - Abnormal; Notable for the following:    Glucose-Capillary 117 (*)    All other components within normal limits  CBC  TROPONIN I    Imaging Review Dg Chest 2 View  03/03/2014   CLINICAL DATA:  Increased shortness of breath for 4 days, back pain, history COPD, diabetes, smoker  EXAM: CHEST  2 VIEW  COMPARISON:  06/02/2013  FINDINGS: Normal heart size, mediastinal contours, and pulmonary vascularity.  Emphysematous and bronchitic changes consistent with history of COPD.  No acute infiltrate, pleural effusion or pneumothorax.  Minimal chronic interstitial  prominence stable.  No acute osseous findings.  IMPRESSION: COPD changes.  No acute abnormalities.   Electronically Signed   By: Ulyses SouthwardMark  Boles M.D.   On: 03/03/2014 17:50     EKG Interpretation   Date/Time:  Thursday March 03 2014 17:14:45 EDT Ventricular Rate:  84 PR Interval:  136 QRS Duration: 100 QT Interval:  362 QTC Calculation: 427 R Axis:   101 Text Interpretation:  Normal sinus rhythm Rightward axis Incomplete right  bundle branch block Borderline ECG No significant  change since last  tracing Confirmed by YAO  MD, DAVID ([id number]) on 03/03/2014 5:18:57 PM      MDM   Final diagnoses:  COPD exacerbation  Neuropathy    Pt no longer wheezing after treatment. Will send home on prednisone and albuterol inhaler. Pt given ultram for neuropathic pain.pt is going [facility]. Educated on possibility of blood sugar elevation    Teressa LowerVrinda Bee Hammerschmidt, NP 03/03/14 [phone]

## 2014-10-29 ENCOUNTER — Encounter (HOSPITAL_BASED_OUTPATIENT_CLINIC_OR_DEPARTMENT_OTHER): Payer: Self-pay | Admitting: *Deleted

## 2014-10-29 ENCOUNTER — Emergency Department (HOSPITAL_BASED_OUTPATIENT_CLINIC_OR_DEPARTMENT_OTHER)
Admission: EM | Admit: 2014-10-29 | Discharge: 2014-10-29 | Disposition: A | Discharge status: Home / Self Care | Payer: Self-pay | Attending: Emergency Medicine | Admitting: Emergency Medicine

## 2014-10-29 DIAGNOSIS — Z720 Tobacco use: Secondary | ICD-10-CM | POA: Insufficient documentation

## 2014-10-29 DIAGNOSIS — R21 Rash and other nonspecific skin eruption: Secondary | ICD-10-CM | POA: Insufficient documentation

## 2014-10-29 DIAGNOSIS — J449 Chronic obstructive pulmonary disease, unspecified: Secondary | ICD-10-CM | POA: Insufficient documentation

## 2014-10-29 DIAGNOSIS — H6691 Otitis media, unspecified, right ear: Secondary | ICD-10-CM | POA: Insufficient documentation

## 2014-10-29 DIAGNOSIS — Z7952 Long term (current) use of systemic steroids: Secondary | ICD-10-CM | POA: Insufficient documentation

## 2014-10-29 DIAGNOSIS — Z880 Allergy status to penicillin: Secondary | ICD-10-CM | POA: Insufficient documentation

## 2014-10-29 DIAGNOSIS — Z8619 Personal history of other infectious and parasitic diseases: Secondary | ICD-10-CM | POA: Insufficient documentation

## 2014-10-29 DIAGNOSIS — E119 Type 2 diabetes mellitus without complications: Secondary | ICD-10-CM | POA: Insufficient documentation

## 2014-10-29 DIAGNOSIS — Z79899 Other long term (current) drug therapy: Secondary | ICD-10-CM | POA: Insufficient documentation

## 2014-10-29 HISTORY — DX: Zoster without complications: B02.9

## 2014-10-29 MED ORDER — ALBUTEROL SULFATE HFA 108 (90 BASE) MCG/ACT IN AERS
2.0000 | INHALATION_SPRAY | Freq: Once | RESPIRATORY_TRACT | Status: AC
  Administered 2014-10-29: 2 via RESPIRATORY_TRACT
  Filled 2014-10-29: qty 6.7

## 2014-10-29 MED ORDER — OXYCODONE-ACETAMINOPHEN 5-325 MG PO TABS: 1.0000 | ORAL_TABLET | Freq: Four times a day (QID) | ORAL | Status: AC | PRN

## 2014-10-29 MED ORDER — AZITHROMYCIN 250 MG PO TABS: 250.0000 mg | ORAL_TABLET | Freq: Every day | ORAL | Status: AC

## 2014-10-29 NOTE — ED Notes (Signed)
Dx with shingles on Wed- taking acyclovir and hydrocodone- rash on right side of trunk- now has pain in right ear

## 2014-10-29 NOTE — Discharge Instructions (Signed)
Shingles Shingles (herpes zoster) is an infection that is caused by the same virus that causes chickenpox (varicella). The infection causes a painful skin rash and fluid-filled blisters, which eventually break open, crust over, and heal. It may occur in any area of the body, but it usually affects only one side of the body or face. The pain of shingles usually lasts about 1 month. However, some people with shingles may develop long-term (chronic) pain in the affected area of the body. Shingles often occurs many years after the person had chickenpox. It is more common:  In people older than 50 years.  In people with weakened immune systems, such as those with HIV, AIDS, or cancer.  In people taking medicines that weaken the immune system, such as transplant medicines.  In people under great stress. CAUSES  Shingles is caused by the varicella zoster virus (VZV), which also causes chickenpox. After a person is infected with the virus, it can remain in the person's body for years in an inactive state (dormant). To cause shingles, the virus reactivates and breaks out as an infection in a nerve root. The virus can be spread from person to person (contagious) through contact with open blisters of the shingles rash. It will only spread to people who have not had chickenpox. When these people are exposed to the virus, they may develop chickenpox. They will not develop shingles. Once the blisters scab over, the person is no longer contagious and cannot spread the virus to others. SIGNS AND SYMPTOMS  Shingles shows up in stages. The initial symptoms may be pain, itching, and tingling in an area of the skin. This pain is usually described as burning, stabbing, or throbbing.In a few days or weeks, a painful red rash will appear in the area where the pain, itching, and tingling were felt. The rash is usually on one side of the body in a band or belt-like pattern. Then, the rash usually turns into fluid-filled  blisters. They will scab over and dry up in approximately 2-3 weeks. Flu-like symptoms may also occur with the initial symptoms, the rash, or the blisters. These may include:  Fever.  Chills.  Headache.  Upset stomach. DIAGNOSIS  Your health care provider will perform a skin exam to diagnose shingles. Skin scrapings or fluid samples may also be taken from the blisters. This sample will be examined under a microscope or sent to a lab for further testing. TREATMENT  There is no specific cure for shingles. Your health care provider will likely prescribe medicines to help you manage the pain, recover faster, and avoid long-term problems. This may include antiviral drugs, anti-inflammatory drugs, and pain medicines. HOME CARE INSTRUCTIONS   Take a cool bath or apply cool compresses to the area of the rash or blisters as directed. This may help with the pain and itching.   Take medicines only as directed by your health care provider.   Rest as directed by your health care provider.  Keep your rash and blisters clean with mild soap and cool water or as directed by your health care provider.  Do not pick your blisters or scratch your rash. Apply an anti-itch cream or numbing creams to the affected area as directed by your health care provider.  Keep your shingles rash covered with a loose bandage (dressing).  Avoid skin contact with:  Babies.   Pregnant women.   Children with eczema.   Elderly people with transplants.   People with chronic illnesses, such as leukemia  or AIDS.   Wear loose-fitting clothing to help ease the pain of material rubbing against the rash.  Keep all follow-up visits as directed by your health care provider.If the area involved is on your face, you may receive a referral for a specialist, such as an eye doctor (ophthalmologist) or an ear, nose, and throat (ENT) doctor. Keeping all follow-up visits will help you avoid eye problems, chronic pain, or  disability.  SEEK IMMEDIATE MEDICAL CARE IF:   You have facial pain, pain around the eye area, or loss of feeling on one side of your face.  You have ear pain or ringing in your ear.  You have loss of taste.  Your pain is not relieved with prescribed medicines.   Your redness or swelling spreads.   You have more pain and swelling.  Your condition is worsening or has changed.   You have a fever. MAKE SURE YOU:  Understand these instructions.  Will watch your condition.  Will get help right away if you are not doing well or get worse. Document Released: 07/01/2005 Document Revised: 11/15/2013 Document Reviewed: 02/13/2012 [facility] Patient Information 2015 [employer], [place]. This information is not intended to replace advice given to you by your health care provider. Make sure you discuss any questions you have with your health care provider.   Otitis Media Otitis media is redness, soreness, and inflammation of the middle ear. Otitis media may be caused by allergies or, most commonly, by infection. Often it occurs as a complication of the common cold. SIGNS AND SYMPTOMS Symptoms of otitis media may include:  Earache.  Fever.  Ringing in your ear.  Headache.  Leakage of fluid from the ear. DIAGNOSIS To diagnose otitis media, your health care provider will examine your ear with an otoscope. This is an instrument that allows your health care provider to see into your ear in order to examine your eardrum. Your health care provider also will ask you questions about your symptoms. TREATMENT  Typically, otitis media resolves on its own within 3-5 days. Your health care provider may prescribe medicine to ease your symptoms of pain. If otitis media does not resolve within 5 days or is recurrent, your health care provider may prescribe antibiotic medicines if he or she suspects that a bacterial infection is the cause. HOME CARE INSTRUCTIONS   If you were prescribed an  antibiotic medicine, finish it all even if you start to feel better.  Take medicines only as directed by your health care provider.  Keep all follow-up visits as directed by your health care provider. SEEK MEDICAL CARE IF:  You have otitis media only in one ear, or bleeding from your nose, or both.  You notice a lump on your neck.  You are not getting better in 3-5 days.  You feel worse instead of better. SEEK IMMEDIATE MEDICAL CARE IF:   You have pain that is not controlled with medicine.  You have swelling, redness, or pain around your ear or stiffness in your neck.  You notice that part of your face is paralyzed.  You notice that the bone behind your ear (mastoid) is tender when you touch it. MAKE SURE YOU:   Understand these instructions.  Will watch your condition.  Will get help right away if you are not doing well or get worse. Document Released: 04/05/2004 Document Revised: 11/15/2013 Document Reviewed: 01/26/2013 [address] [facility] Patient Information 2015 [place], [place]. This information is not intended to replace advice given to you by your health  care provider. Make sure you discuss any questions you have with your health care provider.

## 2014-10-29 NOTE — ED Provider Notes (Signed)
CSN: [id number]     Arrival date & time 10/29/14  1116 History   First MD Initiated Contact with Patient 10/29/14 1147     Chief Complaint  Patient presents with  . Otalgia     (Consider location/radiation/quality/duration/timing/severity/associated sxs/prior Treatment) Patient is a 59 y.o. male presenting with ear pain. The history is provided by the patient. No language interpreter was used.  Otalgia Location:  Right Quality:  Aching Severity:  Severe Timing:  Constant Progression:  Unchanged Chronicity:  New Relieved by:  Nothing Worsened by:  Nothing tried Ineffective treatments:  None tried Associated symptoms: rash   Associated symptoms: no abdominal pain, no congestion, no cough, no diarrhea, no fever, no headaches, no rhinorrhea and no vomiting   Associated symptoms comment:  Developed shingles on right flank and abdomen earlier this week, has been on azithromycin Risk factors: prior ear surgery   Risk factors: no recent travel and no chronic ear infection     Past Medical History  Diagnosis Date  . COPD (chronic obstructive pulmonary disease)   . Diabetes mellitus without complication   . Shingles    Past Surgical History  Procedure Laterality Date  . Inner ear surgery    . Hand surgery    . Adenoidectomy     No family history on file. History  Substance Use Topics  . Smoking status: Current Every Day Smoker -- 0.50 packs/day    Types: Cigarettes  . Smokeless tobacco: Never Used  . Alcohol Use: No    Review of Systems  Constitutional: Negative for fever, activity change, appetite change and fatigue.  HENT: Positive for ear pain. Negative for congestion, facial swelling, rhinorrhea and trouble swallowing.   Eyes: Negative for photophobia and pain.  Respiratory: Negative for cough, chest tightness and shortness of breath.   Cardiovascular: Negative for chest pain and leg swelling.  Gastrointestinal: Negative for nausea, vomiting, abdominal pain, diarrhea  and constipation.  Endocrine: Negative for polydipsia and polyuria.  Genitourinary: Negative for dysuria, urgency, decreased urine volume and difficulty urinating.  Musculoskeletal: Negative for back pain and gait problem.  Skin: Positive for rash. Negative for color change and wound.  Allergic/Immunologic: Negative for immunocompromised state.  Neurological: Negative for dizziness, facial asymmetry, speech difficulty, weakness, numbness and headaches.  Psychiatric/Behavioral: Negative for confusion, decreased concentration and agitation.      Allergies  Penicillins and Codeine  Home Medications   Prior to Admission medications   Medication Sig Start Date End Date Taking? Authorizing Provider  acyclovir (ZOVIRAX) 800 MG tablet Take 800 mg by mouth 5 (five) times daily.   Yes Historical Provider, MD  azithromycin (ZITHROMAX) 250 MG tablet Take 1 tablet (250 mg total) by mouth daily. Take first 2 tablets together, then 1 every day until finished. 10/29/14   Toy CookeyMegan Docherty, MD  oxyCODONE-acetaminophen (ROXICET) 5-325 MG per tablet Take 1-2 tablets by mouth every 6 (six) hours as needed for severe pain. 10/29/14   Toy CookeyMegan Docherty, MD  predniSONE (DELTASONE) 20 MG tablet Take 3 tablets (60 mg total) by mouth daily. 06/03/13   Elpidio AnisShari Upstill, PA-C  predniSONE (DELTASONE) 20 MG tablet Take 2 tablets (40 mg total) by mouth daily. 03/03/14   Teressa LowerVrinda Pickering, NP  traMADol (ULTRAM) 50 MG tablet Take 1 tablet (50 mg total) by mouth every 6 (six) hours as needed. 03/03/14   Teressa LowerVrinda Pickering, NP   BP 113/76 mmHg  Pulse 87  Temp(Src) 98.7 F (37.1 C) (Oral)  Resp 18  Ht 5\' 8"  (1.727 m)  Wt 215 lb (97.523 kg)  BMI 32.70 kg/m2  SpO2 99% Physical Exam  HENT:  Head:    Right Ear: Tympanic membrane is injected.  Right TM is dull and erythematous  Musculoskeletal:       Arms:   ED Course  Procedures (including critical care time) Labs Review Labs Reviewed - No data to display  Imaging  Review No results found.   EKG Interpretation None      MDM   Final diagnoses:  Acute right otitis media, recurrence not specified, unspecified otitis media type    Pt is a 59 y.o. male with Pmhx as above who presents with R ear pain since yesterday after being diagxnosed with shingles earlier this week. He has had chills, no fever,  Nausea, vomiting, diarrhea, or facial pain.  He states that the skin around and behind his ear is painful, but it is inside the ear, which hurts the most.  He is also had decreased hearing on that side.  He has not noticed any shingles lesions on his face.  On physical exam, vital signs are stable.  He is in no acute distress.  He does have shingles lesions on right side of trunk in a dermatomal pattern.  He has a mildly red area near his right eye which he states he believes is from the heating pad .  He was using on his face earlier today.  Right TM is dull and erythematous.  I will place him on azithromycin for likely otitis media, though cannot rule out the he may develop, she was on his face as well, though this would be unlikely.  If he does develop shingles lesions on the face, I asked him to follow up with his eye doctor for eye exam.  I've also refill his pain medication, I have given him an albuterol MDI for home use, and I have encouraged [facility], and help refilling his home prescriptions.  I do not feel he is a good candidate for prednisone given his untreated diabetes, and fact that he is almost 1 week after symptom onset.    Elias Else evaluation in the Emergency Department is complete. It has been determined that no acute conditions requiring further emergency intervention are present at this time. The patient/guardian have been advised of the diagnosis and plan. We have discussed signs and symptoms that warrant return to the ED, such as changes or worsening in symptoms, worsening  pain, fever, trouble breathing, inability to tolerate liquids.       Toy Cookey, MD 10/29/14 [phone]

## 2014-12-31 IMAGING — CR DG CHEST 2V
2 series · 2 of 2 positions shown · non-contrast
Comparison: 06/02/2013

CLINICAL DATA: Increased shortness of breath for 4 days, back pain,
history COPD, diabetes, smoker

EXAM:
CHEST  2 VIEW

[w chest pa]
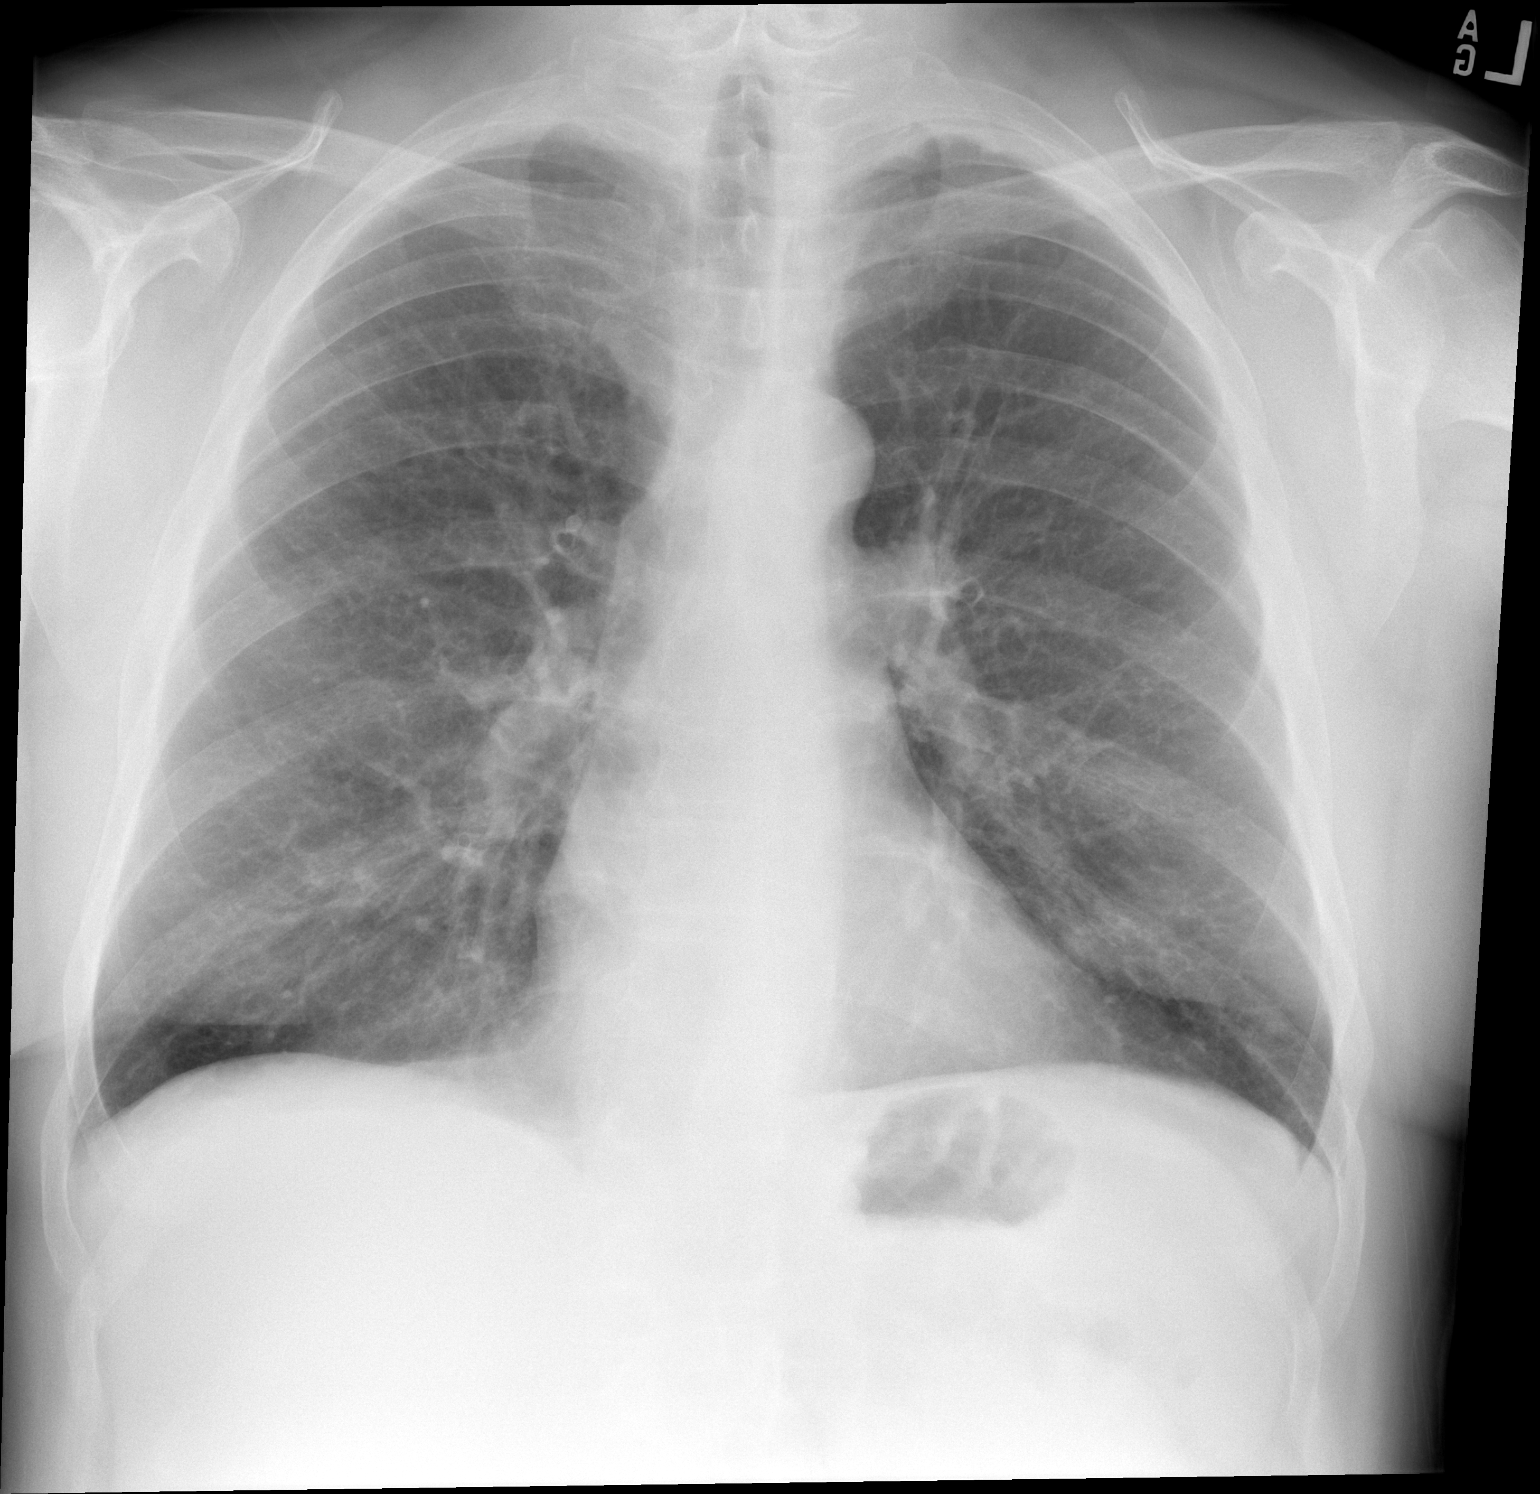

[w chest lat]
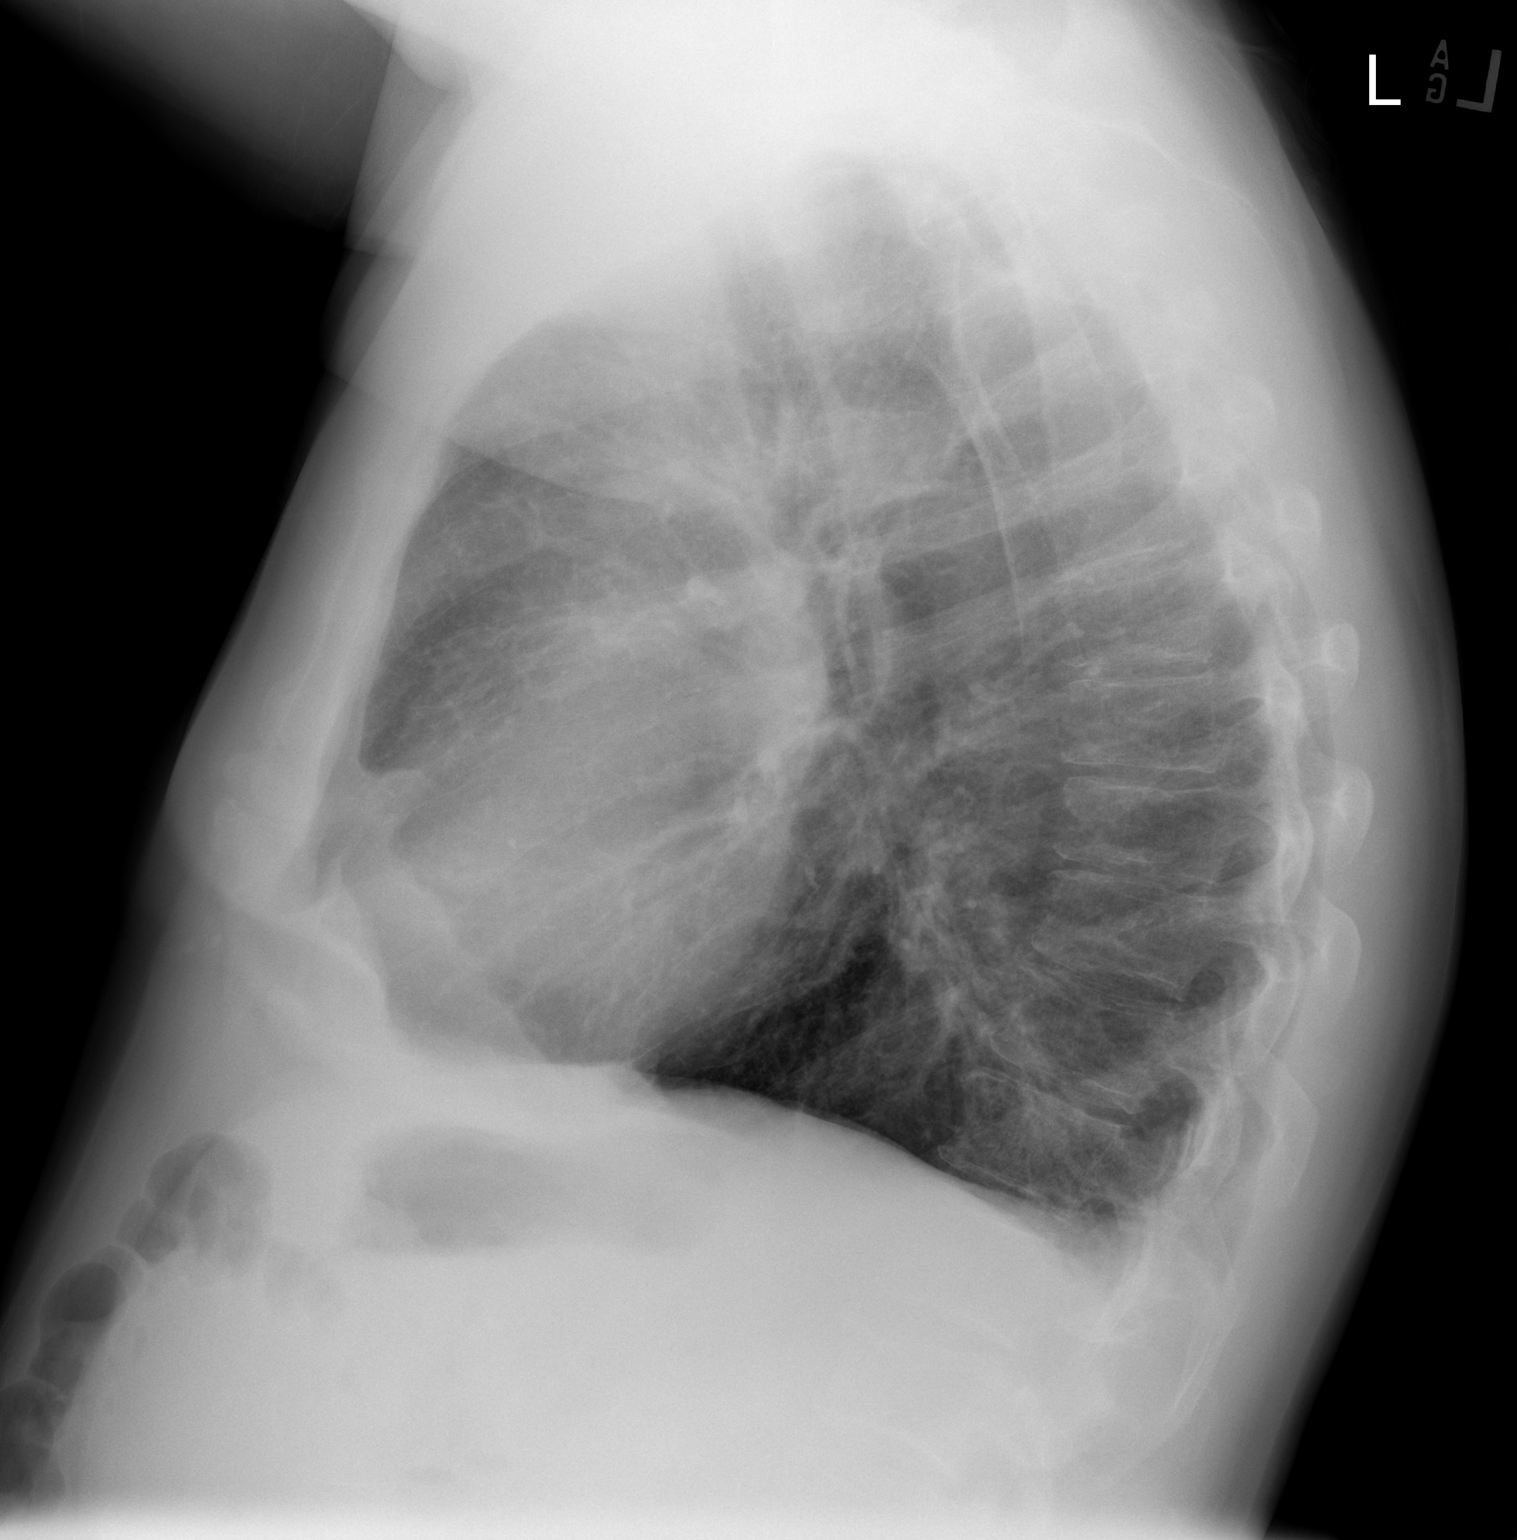

[2 of 2 positions shown; findings below may reference images not displayed]

FINDINGS: Normal heart size, mediastinal contours, and pulmonary vascularity.

Emphysematous and bronchitic changes consistent with history of
COPD.

No acute infiltrate, pleural effusion or pneumothorax.

Minimal chronic interstitial prominence stable.

No acute osseous findings.
IMPRESSION: COPD changes.

No acute abnormalities.

## 2015-06-03 ENCOUNTER — Encounter (HOSPITAL_BASED_OUTPATIENT_CLINIC_OR_DEPARTMENT_OTHER): Payer: Self-pay | Admitting: Emergency Medicine

## 2015-06-03 ENCOUNTER — Emergency Department (HOSPITAL_BASED_OUTPATIENT_CLINIC_OR_DEPARTMENT_OTHER)
Admission: EM | Admit: 2015-06-03 | Discharge: 2015-06-03 | Disposition: A | Discharge status: Home / Self Care | Payer: Self-pay | Attending: Emergency Medicine | Admitting: Emergency Medicine

## 2015-06-03 DIAGNOSIS — Z8619 Personal history of other infectious and parasitic diseases: Secondary | ICD-10-CM | POA: Insufficient documentation

## 2015-06-03 DIAGNOSIS — K0889 Other specified disorders of teeth and supporting structures: Secondary | ICD-10-CM | POA: Insufficient documentation

## 2015-06-03 DIAGNOSIS — F17210 Nicotine dependence, cigarettes, uncomplicated: Secondary | ICD-10-CM | POA: Insufficient documentation

## 2015-06-03 DIAGNOSIS — Z880 Allergy status to penicillin: Secondary | ICD-10-CM | POA: Insufficient documentation

## 2015-06-03 DIAGNOSIS — Z7952 Long term (current) use of systemic steroids: Secondary | ICD-10-CM | POA: Insufficient documentation

## 2015-06-03 DIAGNOSIS — J449 Chronic obstructive pulmonary disease, unspecified: Secondary | ICD-10-CM | POA: Insufficient documentation

## 2015-06-03 DIAGNOSIS — K029 Dental caries, unspecified: Secondary | ICD-10-CM | POA: Insufficient documentation

## 2015-06-03 DIAGNOSIS — Z79899 Other long term (current) drug therapy: Secondary | ICD-10-CM | POA: Insufficient documentation

## 2015-06-03 DIAGNOSIS — E119 Type 2 diabetes mellitus without complications: Secondary | ICD-10-CM | POA: Insufficient documentation

## 2015-06-03 MED ORDER — CLINDAMYCIN HCL 150 MG PO CAPS: 450.0000 mg | ORAL_CAPSULE | Freq: Three times a day (TID) | ORAL | Status: AC

## 2015-06-03 MED ORDER — KETOROLAC TROMETHAMINE 60 MG/2ML IM SOLN
60.0000 mg | Freq: Once | INTRAMUSCULAR | Status: AC
  Administered 2015-06-03: 60 mg via INTRAMUSCULAR
  Filled 2015-06-03: qty 2

## 2015-06-03 NOTE — ED Notes (Signed)
Patient states that he is having facial pain and dental pain to the right side of his head

## 2015-06-03 NOTE — ED Provider Notes (Signed)
CSN: [id number]     Arrival date & time 06/03/15  1842 History   First MD Initiated Contact with Patient 06/03/15 2040     Chief Complaint  Patient presents with  . Dental Pain   HPI  Benjamin Garrison is a 59 year old male with PMHx of COPD and DM presenting with dental pain. Patient reports right lower dental pain for the past few days. The pain is constant and increases with chewing. He states that he has not been able to eat solid food since the pain started because he does not have teeth on the left side of his mouth and he cannot chew on the right side. He states he has been having to eat soups and mashed potatoes. He states that the pain feels like it radiates up towards his right ear and along his jawline. He has not tried any over-the-counter pain relievers. He states that nothing makes the pain better. He has been seen in the emergency department for dental pain in the past and reports following up with a dentist after this. He states that he recently lost his insurance and cannot afford to go to the dentist anymore. Denies fevers, chills, difficulty handling secretions, difficulty swallowing, difficulty breathing, trismus, chest pain, shortness of breath, abdominal pain, nausea or vomiting. He has no other complaints at this time.  Past Medical History  Diagnosis Date  . COPD (chronic obstructive pulmonary disease) (HCC)   . Diabetes mellitus without complication (HCC)   . Shingles    Past Surgical History  Procedure Laterality Date  . Inner ear surgery    . Hand surgery    . Adenoidectomy     History reviewed. No pertinent family history. Social History  Substance Use Topics  . Smoking status: Current Every Day Smoker -- 0.50 packs/day    Types: Cigarettes  . Smokeless tobacco: Never Used  . Alcohol Use: No    Review of Systems  HENT: Positive for dental problem.   All other systems reviewed and are negative.     Allergies  Penicillins and Codeine  Home Medications    Prior to Admission medications   Medication Sig Start Date End Date Taking? Authorizing Provider  gabapentin (NEURONTIN) 300 MG capsule Take 300 mg by mouth 4 (four) times daily.   Yes Historical Provider, MD  venlafaxine (EFFEXOR) 50 MG tablet Take 50 mg by mouth 2 (two) times daily.   Yes Historical Provider, MD  acyclovir (ZOVIRAX) 800 MG tablet Take 800 mg by mouth 5 (five) times daily.    Historical Provider, MD  azithromycin (ZITHROMAX) 250 MG tablet Take 1 tablet (250 mg total) by mouth daily. Take first 2 tablets together, then 1 every day until finished. 10/29/14   Toy Cookey, MD  clindamycin (CLEOCIN) 150 MG capsule Take 3 capsules (450 mg total) by mouth 3 (three) times daily. Take 450 mg (3 tablets) three times a day for 10 days 06/03/15   Rolm Gala Elsy Chiang, PA-C  oxyCODONE-acetaminophen (ROXICET) 5-325 MG per tablet Take 1-2 tablets by mouth every 6 (six) hours as needed for severe pain. 10/29/14   Toy Cookey, MD  predniSONE (DELTASONE) 20 MG tablet Take 3 tablets (60 mg total) by mouth daily. 06/03/13   Elpidio Anis, PA-C  predniSONE (DELTASONE) 20 MG tablet Take 2 tablets (40 mg total) by mouth daily. 03/03/14   Teressa Lower, NP  traMADol (ULTRAM) 50 MG tablet Take 1 tablet (50 mg total) by mouth every 6 (six) hours as needed. 03/03/14  Teressa LowerVrinda Pickering, NP   BP 132/77 mmHg  Pulse 93  Temp(Src) 98.2 F (36.8 C) (Oral)  Resp 20  Ht 5\' 8"  (1.727 m)  Wt 205 lb (92.987 kg)  BMI 31.18 kg/m2  SpO2 96% Physical Exam  Constitutional: He appears well-developed and well-nourished. No distress.  Patient is nontoxic-appearing  HENT:  Head: Normocephalic and atraumatic.  Mouth/Throat: Uvula is midline, oropharynx is clear and moist and mucous membranes are normal. Mucous membranes are not dry. No trismus in the jaw. Abnormal dentition. Dental caries present. No dental abscesses or uvula swelling. No oropharyngeal exudate.    Patient with many missing teeth. Only one molar  remains in the right lower jaw. This tooth is tender to palpation tongue depressor. No erythema of the gums surrounding. No obvious abscess. Uvula midline without swelling. No swelling or erythema of the cheeks.  Eyes: Conjunctivae are normal. Right eye exhibits no discharge. Left eye exhibits no discharge. No scleral icterus.  Neck: Normal range of motion. Neck supple.  Neck is supple without cervical adenopathy. No swelling or tenderness of the soft tissues of the neck. Full range of motion intact.  Cardiovascular: Normal rate, regular rhythm and normal heart sounds.   Pulmonary/Chest: Effort normal and breath sounds normal. No respiratory distress. He has no wheezes.  Breathing unlabored.  Musculoskeletal: Normal range of motion.  Lymphadenopathy:    He has no cervical adenopathy.  Neurological: He is alert. Coordination normal.  Skin: Skin is warm and dry.  Psychiatric: He has a normal mood and affect. His behavior is normal.  Nursing note and vitals reviewed.   ED Course  Procedures (including critical care time) Labs Review Labs Reviewed - No data to display  Imaging Review No results found. I have personally reviewed and evaluated these images and lab results as part of my medical decision-making.   EKG Interpretation None      MDM   Final diagnoses:  Pain, dental   Patient presenting with tooth pain. No obvious abscess on exam. No soft tissue swelling of the cheek or neck. Exam unconcerning for Ludwig's angina or spread of infection.  Pain controlled in ED with percocet. Will discharge with clindamycin due to penicillin allergy and encouraged pt to follow-up with dentist. Resource guide given in discharge paperwork. Return precautions discussed with pt and given in discharge paperwork. Stable for discharge.      Alveta HeimlichStevi Shadman Tozzi, PA-C 06/03/15 2139  Tilden FossaElizabeth Rees, MD 06/04/15 1721

## 2015-06-03 NOTE — Discharge Instructions (Signed)
Schedule a follow-up appointment with dentist from the resource guide for as soon as possible.   Emergency Department Resource Guide 1) Find a Doctor and Pay Out of Pocket Although you won't have to find out who is covered by your insurance plan, it is a good idea to ask around and get recommendations. You will then need to call the office and see if the doctor you have chosen will accept you as a new patient and what types of options they offer for patients who are self-pay. Some doctors offer discounts or will set up payment plans for their patients who do not have insurance, but you will need to ask so you aren't surprised when you get to your appointment.  2) Contact Your Local Health Department Not all health departments have doctors that can see patients for sick visits, but many do, so it is worth a call to see if yours does. If you don't know where your local health department is, you can check in your phone book. The CDC also has a tool to help you locate your state's health department, and many state websites also have listings of all of their local health departments.  3) Find a Walk-in Clinic If your illness is not likely to be very severe or complicated, you may want to try a walk in clinic. These are popping up all over the country in pharmacies, drugstores, and shopping centers. They're usually staffed by nurse practitioners or physician assistants that have been trained to treat common illnesses and complaints. They're usually fairly quick and inexpensive. However, if you have serious medical issues or chronic medical problems, these are probably not your best option.  No Primary Care Doctor: - Call Health Connect at  [phone] - they can help you locate a primary care doctor that  accepts your insurance, provides certain services, etc. - Physician Referral Service- [phone]  Chronic Pain Problems: Organization         Address  Phone   Notes  [facility]   [phone] Patients need to be referred by their primary care doctor.   Medication Assistance: Organization         Address  Phone   Notes  [facility] [address] [place], [place] [address] [phone]) [phone] --Must be a resident of [facility] [place] -- Must have NO insurance coverage whatsoever (no Medicaid/ Medicare, etc.) -- The pt. MUST have a primary care doctor that directs their care regularly and follows them in the community   Med[facility]ist  [phone]   [employer]  [phone]    Agencies that provide inexpensive medical care: Organization         Address  Phone   Notes  [facility]  [phone]   [facility]    [phone]) [phone]   [facility] [address] [place], [place] [address] [phone]   [facility] [address], [place] [phone]) [phone]   [facility]    [phone]   [facility]    [phone]   [facility]  [address], [place] Phone:  [phone]) [phone], Fax:  [phone] Hours of Operation:  9 am - 6 pm, M-F.  Also accepts Medicaid/Medicare and self-pay.  [facility]  [address], [place] Phone: [phone], Fax: [phone]. Hours of Operation:  8:30 am - 5:30 pm, M-F.  Also accepts Medicaid and self-pay.  [facility] [place] [address] [place] Phone: [phone]   [place], [place], [place] [phone], Ext. [phone] Mondays & Thursdays: 7-9 AM.  First 15 patients are seen on a first come, first serve basis.    [place] Providers:  Organization         Address  Phone   Notes  [facility] [address], [place] [phone] Also accepts self-pay patients.  [facility] [address], [place]  [phone]   [place], [address], [place] [phone]   [facility] [address], [place] [phone]   Lucianne Lei [address], [place]   [phone] Only accepts [place] Access [place] patients after they have their name applied to their card.   Self-Pay (no insurance) in [facility]:  Organization         Address  Phone   Notes  Sickle Cell Patients, [facility] [place] [phone]   [facility] [place] [phone]   [facility] [place]  [place], [place], [employer] [phone]   [facility]/Dr. Osei-Bonsu  [address] [place] or [address], [place] [phone] Phone number for both [place] and [place] locations is the same.  Urgent Medical and [facility] [address], [place] [phone]   [facility] [address] [place] or [address] [phone]   [facility] [address] [place] [phone], phone; [phone], fax Sees patients 1st and 3rd Saturday of every month.  Must not qualify for public or private insurance (i.e. Medicaid, Medicare, [place] Health Choice, Veterans' Benefits)  Household income should be no more than 200% of the poverty level The clinic cannot treat you if you are pregnant or think you are pregnant  Sexually transmitted diseases are not treated at the clinic.    Dental Care: Organization         Address  Phone  Notes  [facility] Department of [facility] [place] [phone] Accepts children up to age 10 who are enrolled in [place] or [place]; pregnant women with a Medicaid card; and children who have applied for Medicaid or [place] Health  Choice, but were declined, whose parents can pay a reduced fee at time of service.  [facility] Department of [facility]  [address], [place] [phone] Accepts children up to age 32 who are enrolled in [place] or [place]; pregnant women with a Medicaid card; and children who have applied for Medicaid or [place] Health Choice, but were declined, whose parents can pay a reduced fee at time of service.  [place] Adult Dental Access PROGRAM  [place] [phone] Patients are seen by appointment only. Walk-ins are not accepted. [place] will see patients 78 years of age and older. Monday - Tuesday (8am-5pm) Most Wednesdays (8:30-5pm) $30 per visit, cash only  [facility] Adult Dental Access PROGRAM  [address], [facility] [phone] Patients are seen by appointment  only. Walk-ins are not accepted. [place] will see patients 69 years of age and older. One Wednesday Evening (Monthly: Volunteer Based).  $30 per visit, cash only  [place]  [phone] for adults; Children under age 5, call Graduate Pediatric Dentistry at [phone]. Children aged 58-14, please call [phone] to request a pediatric application.  Dental services are provided in all areas of dental care including fillings, crowns and bridges, complete and partial dentures, implants, gum treatment, root canals, and extractions. Preventive care is also provided. Treatment is provided to both adults and children. Patients are selected via a lottery and there is often a waiting list.   [facility] [address], [place]  [phone] [email or web address]   [facility] [address] [place], [place] [phone], Ext. [phone] Second and Fourth Thursday of each month, opens at 6:30 AM; Clinic ends at 9 AM.  Patients are seen on a first-come first-served basis, and a limited number are seen during each  clinic.   [facility]  [address] Hillard Danker [place], [place] [phone]   Eligibility Requirements You must have lived in [place], [place], or [place] counties for at least the last three months.   You cannot be eligible for state or federal sponsored [employer], including [employer], [place], or [employer].   You generally cannot be eligible for healthcare insurance through your employer.    How to apply: Eligibility screenings are held every Tuesday and Wednesday afternoon from 1:00 pm until 4:00 pm. You do not need an appointment for the interview!  [facility] [address] [place], [place]   [place]  [place] Department  [place]  [phone]    Behavioral Health Resources in the Community: Intensive Outpatient Programs Organization         Address  Phone  Notes  [place]. [address] [place], [place] [phone]   [facility]patient [address], [place], [place]   ADS: Alcohol & Drug Svcs [address] [place], [place]   [place] [address]  [place], [place] or [phone]   Substance Abuse Resources Organization         Address  Phone  Notes  Alcohol and Drug Services  [phone]   [place]  [phone]   [facility] [place]   Chinita Pester  [phone]   Residential & Outpatient Substance Abuse Program  [phone]   Psychological Services Organization         Address  Phone  Notes  [facility] [place]  [place]  [phone]   [place] [address] [place] or [phone]    Mobile Crisis Teams Organization         Address  Phone  Notes  Therapeutic Alternatives, Mobile  Crisis Care Unit  [phone]   Assertive Psychotherapeutic Services  [address] [place], [place]   [place] Levels [address] [place] [phone]    Self-Help/Support Groups Organization         Address  Phone             Notes  [place]. of  - variety of support groups  [place] Call for more information  Narcotics Anonymous (NA), [facility] [address], [employer] [place]  2 meetings at this location   Special educational needs teacher         Address  Phone  Notes  ASAP Residential Treatment [place],    [place]  [phone]   [facility]  [address] [place] [address], [place], [place]   [place], [place] [phone] Admissions: 8am-3pm M-F  Incentives Substance [place] [address]    [place], [place] [phone]   [facility] [address] [place], [place], [place]   [facility] [address],  [place], [place]   [facility]ight Programs - Intensive Outpatient [address], [place], [place]   [facility] ([place].,  [place], [place] [phone] or [phone]   [facility] Services (RTS) [address] [place], [place] Accepts Medicaid  [facility] [place] [address],  [place] [phone] Substance Abuse/Addiction Treatment   [facility] Organization         Address  Phone  Notes  [facility]  [phone]   Domenic Schwab, PhD [address] Arlis Porta [place], [place]   [phone] or [phone]   [place], [place] [phone]   [facility] [address], [place], [place] [phone] Insurance/Medicaid/sponsorship through [facility] and Families [address], [address]  [place]                                    [place], [place] [phone] [place] [address], [place] [phone]    Dr. Adele Schilder  [phone]   [facility] Dept. 1) [address], [place] 2) [place] 3)  [place] [address], [place] [phone]  [phone]   [place] [phone] or [phone] (After Hours)
